# Patient Record
Sex: Female | Born: 1950 | Race: White | Hispanic: No | Marital: Single | Smoking: Current every day smoker
Health system: Southern US, Community
[De-identification: ages and names within clinical notes are randomized; demographics above are authoritative.]

## PROBLEM LIST (undated history)

## (undated) DIAGNOSIS — J449 Chronic obstructive pulmonary disease, unspecified: Secondary | ICD-10-CM

## (undated) HISTORY — PX: CARDIAC SURGERY: SHX584

---

## 2016-08-24 ENCOUNTER — Emergency Department: Payer: Managed Care, Other (non HMO)

## 2016-08-24 ENCOUNTER — Encounter: Payer: Self-pay | Admitting: Emergency Medicine

## 2016-08-24 ENCOUNTER — Emergency Department
Admission: EM | Admit: 2016-08-24 | Discharge: 2016-08-24 | Disposition: A | Discharge status: Home / Self Care | Payer: Managed Care, Other (non HMO) | Attending: Emergency Medicine | Admitting: Emergency Medicine

## 2016-08-24 DIAGNOSIS — J449 Chronic obstructive pulmonary disease, unspecified: Secondary | ICD-10-CM | POA: Insufficient documentation

## 2016-08-24 DIAGNOSIS — R42 Dizziness and giddiness: Secondary | ICD-10-CM

## 2016-08-24 DIAGNOSIS — Z79899 Other long term (current) drug therapy: Secondary | ICD-10-CM | POA: Insufficient documentation

## 2016-08-24 DIAGNOSIS — Z7951 Long term (current) use of inhaled steroids: Secondary | ICD-10-CM | POA: Insufficient documentation

## 2016-08-24 DIAGNOSIS — F17210 Nicotine dependence, cigarettes, uncomplicated: Secondary | ICD-10-CM | POA: Diagnosis not present

## 2016-08-24 DIAGNOSIS — R51 Headache: Secondary | ICD-10-CM | POA: Insufficient documentation

## 2016-08-24 DIAGNOSIS — Z7982 Long term (current) use of aspirin: Secondary | ICD-10-CM | POA: Insufficient documentation

## 2016-08-24 DIAGNOSIS — H8149 Vertigo of central origin, unspecified ear: Secondary | ICD-10-CM | POA: Insufficient documentation

## 2016-08-24 HISTORY — DX: Chronic obstructive pulmonary disease, unspecified: J44.9

## 2016-08-24 LAB — BASIC METABOLIC PANEL
Anion gap: 10 (ref 5–15)
BUN: 14 mg/dL (ref 6–20)
CHLORIDE: 104 mmol/L (ref 101–111)
CO2: 25 mmol/L (ref 22–32)
CREATININE: 0.72 mg/dL (ref 0.44–1.00)
Calcium: 9.5 mg/dL (ref 8.9–10.3)
GFR calc Af Amer: >60 mL/min (ref >60)
GLUCOSE: 133 mg/dL — AB (ref 65–99)
Potassium: 3.7 mmol/L (ref 3.5–5.1)
SODIUM: 139 mmol/L (ref 135–145)

## 2016-08-24 LAB — CBC
HEMATOCRIT: 44.3 % (ref 35.0–47.0)
Hemoglobin: 15.5 g/dL (ref 12.0–16.0)
MCH: 34.1 pg — ABNORMAL HIGH (ref 26.0–34.0)
MCHC: 35 g/dL (ref 32.0–36.0)
MCV: 97.5 fL (ref 80.0–100.0)
PLATELETS: 219 10*3/uL (ref 150–440)
RBC: 4.54 MIL/uL (ref 3.80–5.20)
RDW: 12.6 % (ref 11.5–14.5)
WBC: 11.8 10*3/uL — AB (ref 3.6–11.0)

## 2016-08-24 LAB — GLUCOSE, CAPILLARY: Glucose-Capillary: 122 mg/dL — ABNORMAL HIGH (ref 65–99)

## 2016-08-24 LAB — TROPONIN I: Troponin I: <0.03 ng/mL (ref <0.03)

## 2016-08-24 MED ORDER — MECLIZINE HCL 25 MG PO TABS: 25.0000 mg | ORAL_TABLET | Freq: Three times a day (TID) | ORAL | 0 refills | Status: AC | PRN

## 2016-08-24 MED ORDER — SODIUM CHLORIDE 0.9 % IV SOLN
1000.0000 mL | Freq: Once | INTRAVENOUS | Status: AC
  Administered 2016-08-24: 1000 mL via INTRAVENOUS

## 2016-08-24 NOTE — ED Triage Notes (Signed)
Pt helped out of POV to triage room. Pt reports she was eating dinner when she turned head, heard "Popping" sound and became very dizzy and weak. Pt sts she has had headache for 1 week and has experienced episodes of dizziness during the last month.

## 2016-08-24 NOTE — ED Notes (Signed)
Per pt, she had an episode earlier in which she heard a "pop" in her head and felt dizzy. Pt states had a 6 day headache approx 10 days ago. States the dizziness is like the room moving.

## 2016-08-24 NOTE — ED Notes (Signed)
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

## 2016-08-24 NOTE — ED Provider Notes (Signed)
[facility] Emergency Department Provider Note   ____________________________________________    I have reviewed the triage vital signs and the nursing notes.   HISTORY  Chief Complaint Weakness     HPI Jamie Wheeler is a 66 y.o. female who presents with complaints of dizziness. Patient reports she was eating dinner at [employer] when she felt a pop in the right side of her head and became acutely dizzy which she describes as a sensation of the room moving. She denies headache currently. No neuro deficits. She continues to have mild vertiginous symptoms. She has never had this before. However she does report that she had a headache for 6 days last week finally resolved. She denies visual changes. No tinnitus. She does have a history of vertigo in the past   Past Medical History:  Diagnosis Date  . COPD (chronic obstructive pulmonary disease) (HCC)     There are no active problems to display for this patient.   Past Surgical History:  Procedure Laterality Date  . CARDIAC SURGERY      Prior to Admission medications   Medication Sig Start Date End Date Taking? Authorizing Provider  aspirin 81 MG tablet Take 1 tablet by mouth daily. 08/15/06  Yes [provider]  atenolol (TENORMIN) 25 MG tablet Take 1 tablet by mouth daily. 05/19/16  Yes [provider]  atorvastatin (LIPITOR) 20 MG tablet Take 1 tablet by mouth daily. 05/19/16  Yes [provider]  Ipratropium-Albuterol (COMBIVENT) 20-100 MCG/ACT AERS respimat Inhale 1 puff into the lungs 4 (four) times daily. 04/11/16  Yes [provider]  nitroGLYCERIN (NITROSTAT) 0.4 MG SL tablet Place 1 tablet under the tongue as needed. 01/12/10  Yes [provider]  QVAR REDIHALER 80 MCG/ACT AERB Inhale 2 puffs into the lungs 2 (two) times daily. 06/17/16  Yes [provider]  valACYclovir (VALTREX) 1000 MG tablet Take 1 tablet by mouth daily. 05/20/16   Yes [provider]  meclizine (ANTIVERT) 25 MG tablet Take 1 tablet (25 mg total) by mouth 3 (three) times daily as needed for dizziness. 08/24/16   Jene Every, MD     Allergies Codeine  History reviewed. No pertinent family history.  Social History Social History  Substance Use Topics  . Smoking status: Current Every Day Smoker    Packs/day: 0.50    Types: Cigarettes  . Smokeless tobacco: Never Used  . Alcohol use Yes    Review of Systems  Constitutional: No fever/chills Eyes: No visual changes.  ENT: No sore throat. Cardiovascular: Denies chest pain. Respiratory: Denies shortness of breath. Gastrointestinal: No abdominal pain.  No nausea, no vomiting.   Genitourinary: Negative for dysuria. Musculoskeletal: Negative for back pain. Skin: Negative for rash. Neurological: Negative for Neuro deficits   ____________________________________________   PHYSICAL EXAM:  VITAL SIGNS: ED Triage Vitals  Enc Vitals Group     BP 08/24/16 1938 133/63     Pulse Rate 08/24/16 1938 80     Resp 08/24/16 1938 16     Temp 08/24/16 1938 98.1 F (36.7 C)     Temp Source 08/24/16 1938 Oral     SpO2 08/24/16 1938 96 %     Weight 08/24/16 1935 78.9 kg (174 lb)     Height 08/24/16 1935 1.651 m (5\' 5" )     Head Circumference --      Peak Flow --      Pain Score --      Pain  Loc --      Pain Edu? --      Excl. in GC? --     Constitutional: Alert and oriented. No acute distress. Pleasant and interactive Eyes: Conjunctivae are normal. PERRLA, EOMI, visual fields normal Head: Atraumatic. Nose: No congestion/rhinnorhea. Mouth/Throat: Mucous membranes are moist.   Neck:  Painless ROM Cardiovascular: Normal rate, regular rhythm. Grossly normal heart sounds.  Good peripheral circulation. Respiratory: Normal respiratory effort.  No retractions. Lungs CTAB. Gastrointestinal: Soft and nontender. No distention.  No CVA tenderness. Genitourinary: deferred Musculoskeletal: No  lower extremity tenderness nor edema.  Warm and well perfused Neurologic:  Normal speech and language. No gross focal neurologic deficits are appreciated. Normal strength in all extremities. Cranial nerves II through XII normal Skin:  Skin is warm, dry and intact. No rash noted. Psychiatric: Mood and affect are normal. Speech and behavior are normal.  ____________________________________________   LABS (all labs ordered are listed, but only abnormal results are displayed)  Labs Reviewed  BASIC METABOLIC PANEL - Abnormal; Notable for the following:       Result Value   Glucose, Bld 133 (*)    All other components within normal limits  CBC - Abnormal; Notable for the following:    WBC 11.8 (*)    MCH 34.1 (*)    All other components within normal limits  GLUCOSE, CAPILLARY - Abnormal; Notable for the following:    Glucose-Capillary 122 (*)    All other components within normal limits  TROPONIN I  URINALYSIS, COMPLETE (UACMP) WITH MICROSCOPIC  CBG MONITORING, ED   ____________________________________________  EKG  ED ECG REPORT I, Jene EveryKINNER, Davin Archuletta, the attending physician, personally viewed and interpreted this ECG.  Date: 08/24/2016  Rate: 77 Rhythm: normal sinus rhythm QRS Axis: normal Intervals: normal ST/T Wave abnormalities: Nonspecific Conduction Disturbances: none   ____________________________________________  RADIOLOGY  CT head unremarkable MR brain normal ____________________________________________   PROCEDURES  Procedure(s) performed: No    Critical Care performed: No ____________________________________________   INITIAL IMPRESSION / ASSESSMENT AND PLAN / ED COURSE  Pertinent labs & imaging results that were available during my care of the patient were reviewed by me and considered in my medical decision making (see chart for details).  Patient overall well-appearing and in no acute distress. Lab works is far reassuring. CT head pending.    CT head is unremarkable, we will obtain MRI ----------------------------------------- 10:39 PM on 08/24/2016 -----------------------------------------   MRI unremarkable, patient feels quite well, she is greatly relieved. At this point I feel she is appropriate for discharge with outpatient follow-up as needed. I will Rx meclizine   ____________________________________________   FINAL CLINICAL IMPRESSION(S) / ED DIAGNOSES  Final diagnoses:  Vertigo      NEW MEDICATIONS STARTED DURING THIS VISIT:  New Prescriptions   MECLIZINE (ANTIVERT) 25 MG TABLET    Take 1 tablet (25 mg total) by mouth 3 (three) times daily as needed for dizziness.     Note:  This document was prepared using Dragon voice recognition software and may include unintentional dictation errors.    Jene EveryKinner, Eyla Tallon, MD 08/24/16 2239

## 2019-01-28 IMAGING — CT CT HEAD W/O CM
3 series · 16 of 47 positions shown, 19 images · non-contrast
Comparison: None.

CLINICAL DATA: Dizziness

EXAM:
CT HEAD WITHOUT CONTRAST
TECHNIQUE: Contiguous axial images were obtained from the base of the skull
through the vertex without intravenous contrast.

[Series 2: head wo · axial · 0.42mm/px · z∈[-116,+14]mm · 10 of 32 slices shown, 13 images]
[im 3/32  brain]
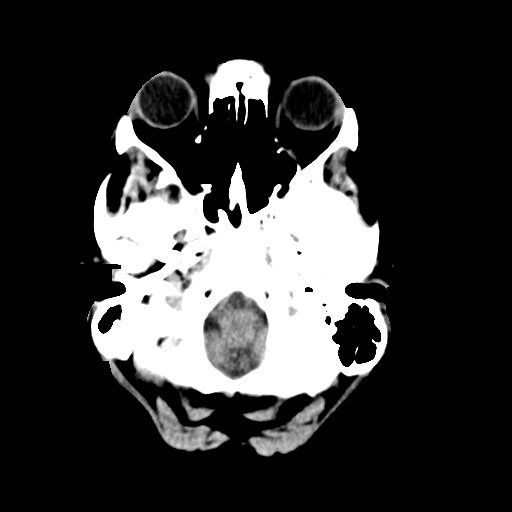
[im 3/32  bone]
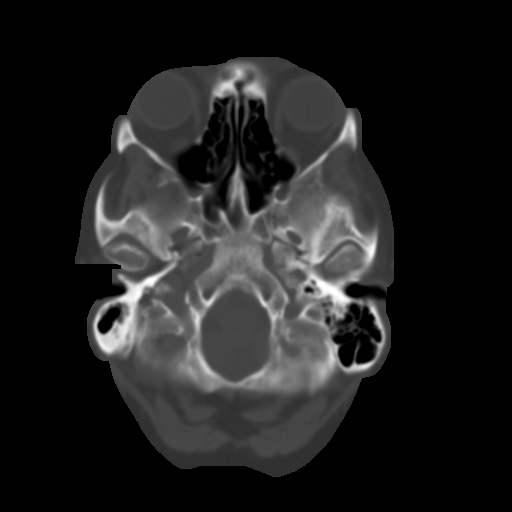
[im 6/32  brain]
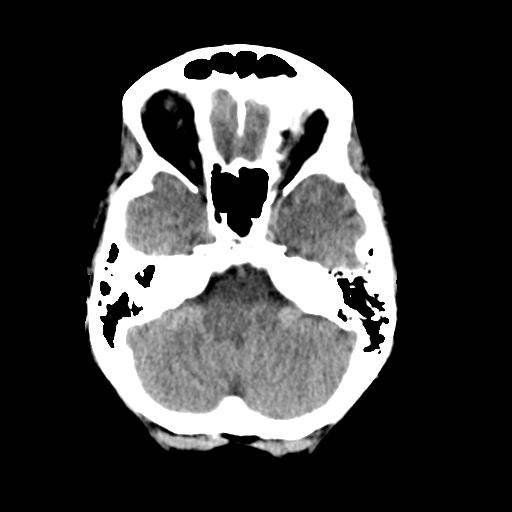
[im 9/32  brain]
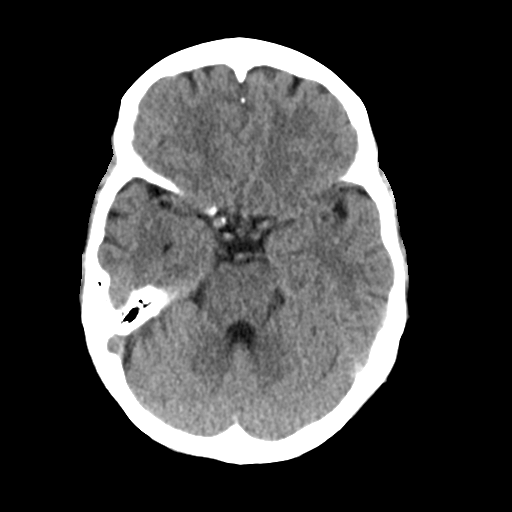
[im 11/32  brain]
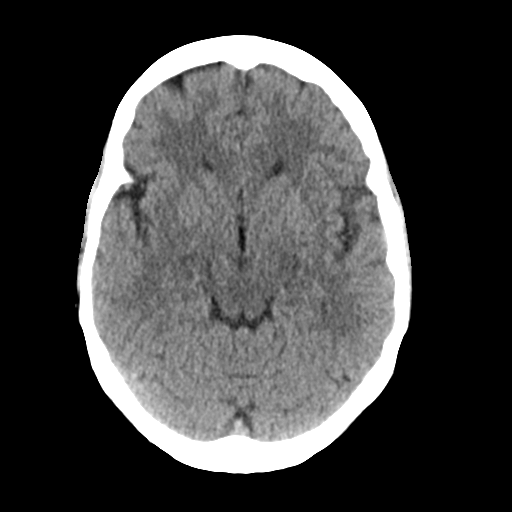
[im 14/32  brain]
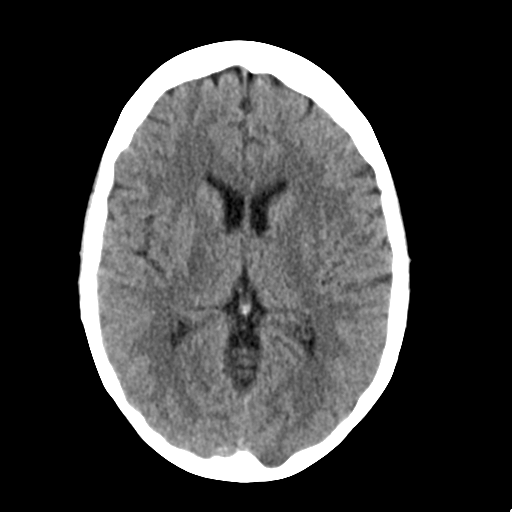
[im 14/32  bone]
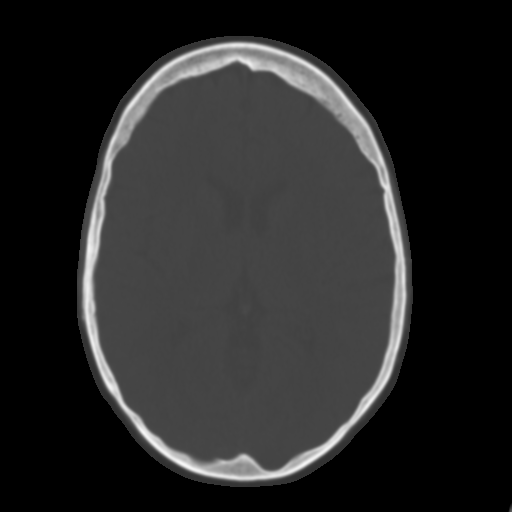
[im 18/32  brain]
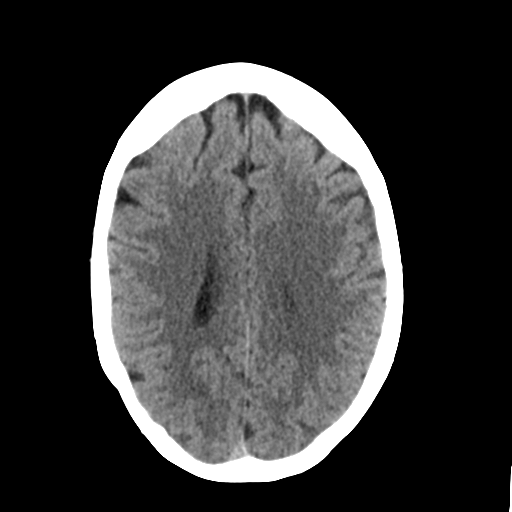
[im 21/32  brain]
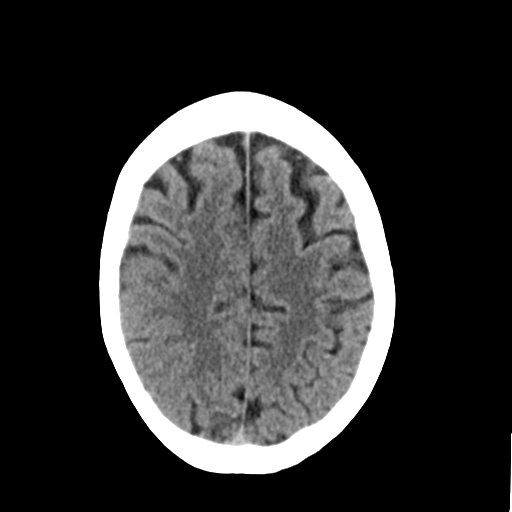
[im 24/32  brain]
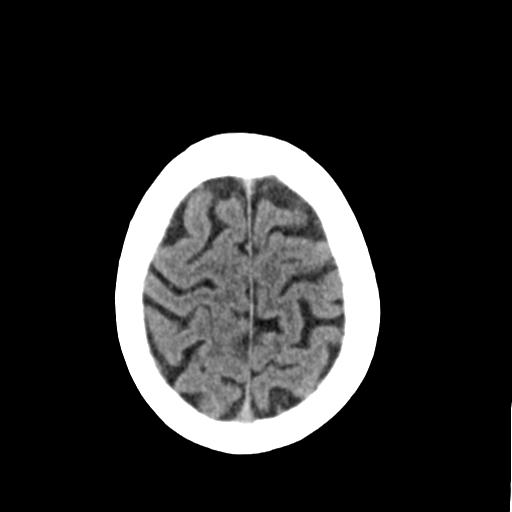
[im 26/32  brain]
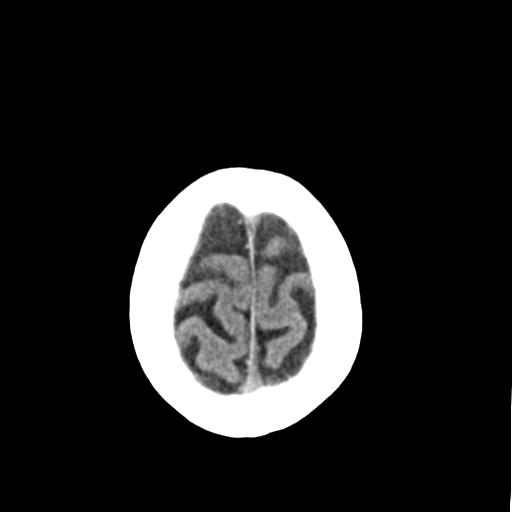
[im 26/32  bone]
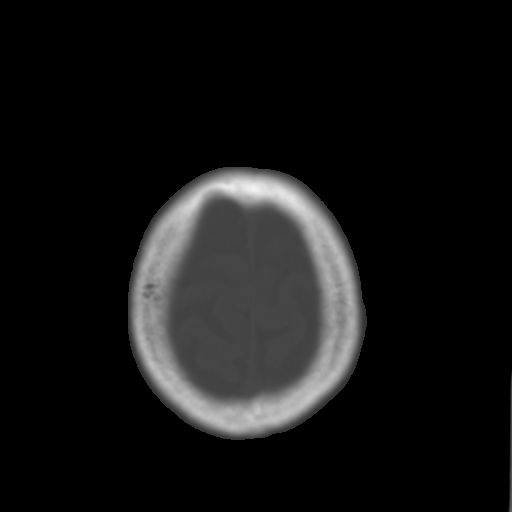
[im 29/32  brain]
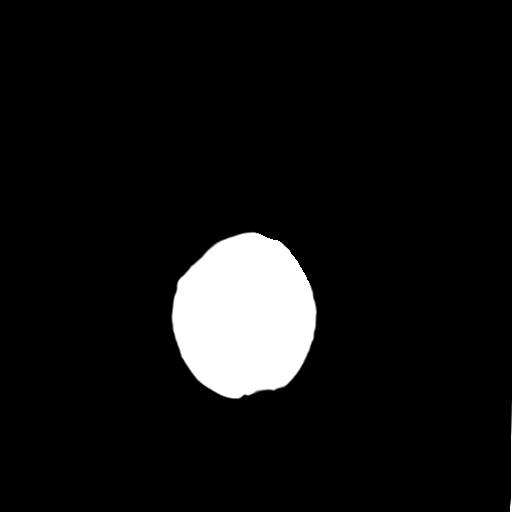

[Series 4: coronal soft tissue · coronal · 0.31mm/px · 3 of 67 slices shown]
[im 23/67  brain]
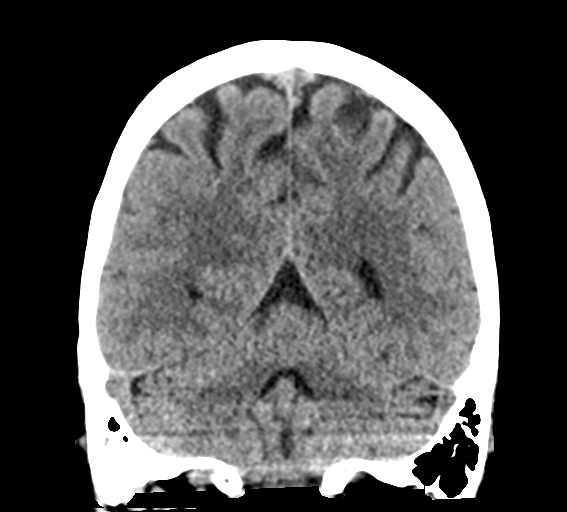
[im 30/67  brain]
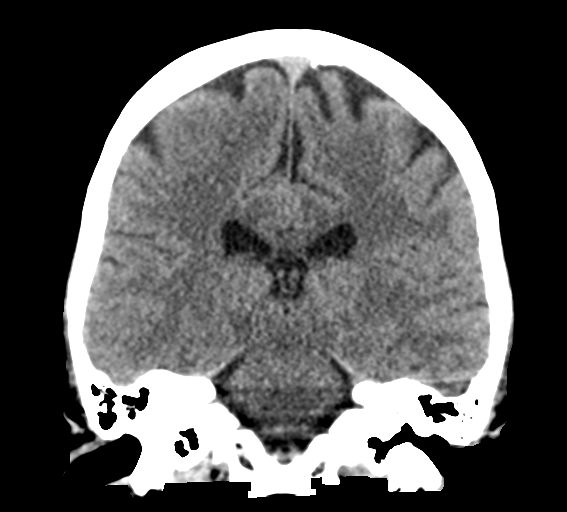
[im 37/67  brain]
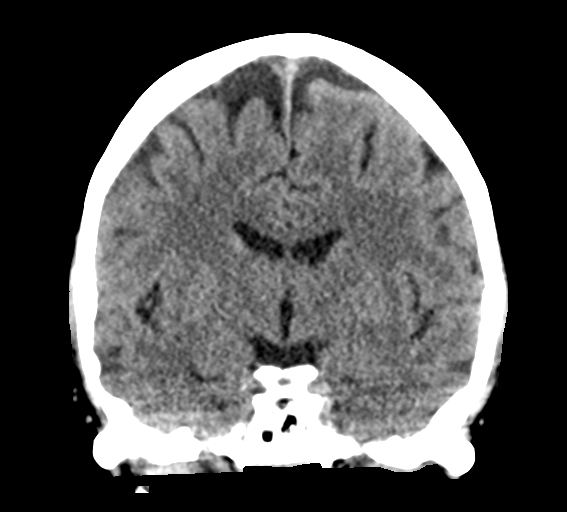

[Series 5: sagittal soft tissue · sagittal · 0.32mm/px · 3 of 50 slices shown]
[im 17/50  brain]
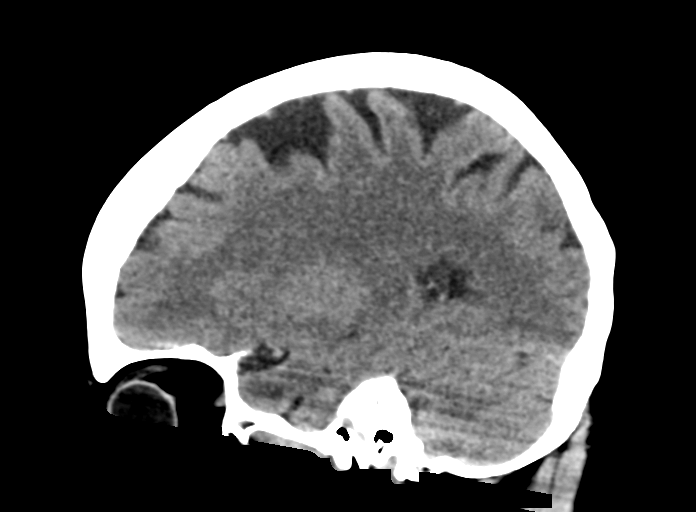
[im 25/50  brain]
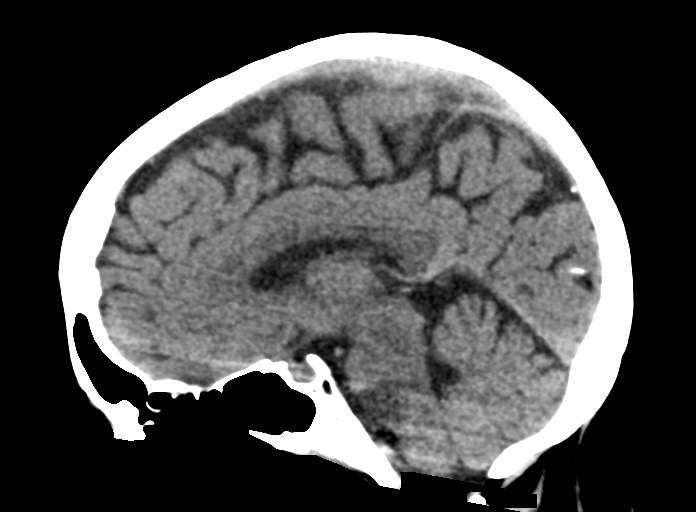
[im 33/50  brain]
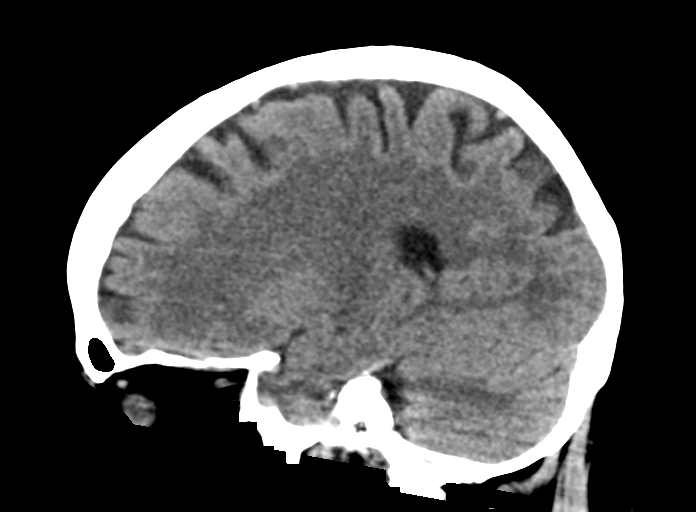

[16 of 47 positions shown; findings below may reference images not displayed]

FINDINGS: Brain: No evidence of acute infarction, hemorrhage, hydrocephalus,
extra-axial collection or mass lesion/mass effect.

Vascular: No hyperdense vessel or unexpected calcification.

Skull: Normal. Negative for fracture or focal lesion.

Sinuses/Orbits: No acute finding.

Other: None.
IMPRESSION: No acute intracranial abnormality noted.
